# Patient Record
Sex: Female | Born: 2005 | Race: Black or African American | Hispanic: No | Marital: Single | Smoking: Never smoker
Health system: Southern US, Community
[De-identification: ages and names within clinical notes are randomized; demographics above are authoritative.]

## PROBLEM LIST (undated history)

## (undated) DIAGNOSIS — E739 Lactose intolerance, unspecified: Secondary | ICD-10-CM

## (undated) DIAGNOSIS — J329 Chronic sinusitis, unspecified: Secondary | ICD-10-CM

## (undated) DIAGNOSIS — R011 Cardiac murmur, unspecified: Secondary | ICD-10-CM

## (undated) DIAGNOSIS — T7840XA Allergy, unspecified, initial encounter: Secondary | ICD-10-CM

## (undated) HISTORY — PX: INCISION AND DRAINAGE ABSCESS / HEMATOMA OF BURSA / KNEE / THIGH: SUR668

---
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
## 2023-03-21 DIAGNOSIS — G4700 Insomnia, unspecified: Secondary | ICD-10-CM | POA: Diagnosis not present

## 2023-03-21 DIAGNOSIS — F410 Panic disorder [episodic paroxysmal anxiety] without agoraphobia: Secondary | ICD-10-CM | POA: Diagnosis not present

## 2023-03-21 DIAGNOSIS — F419 Anxiety disorder, unspecified: Secondary | ICD-10-CM | POA: Diagnosis not present

## 2023-04-02 DIAGNOSIS — F331 Major depressive disorder, recurrent, moderate: Secondary | ICD-10-CM | POA: Diagnosis not present

## 2023-04-09 DIAGNOSIS — F331 Major depressive disorder, recurrent, moderate: Secondary | ICD-10-CM | POA: Diagnosis not present

## 2023-04-30 DIAGNOSIS — F331 Major depressive disorder, recurrent, moderate: Secondary | ICD-10-CM | POA: Diagnosis not present
